# Patient Record
Sex: Male | Born: 2000 | Race: White | Hispanic: No | Marital: Single | Smoking: Never smoker
Health system: Southern US, Community
[De-identification: ages and names within clinical notes are randomized; demographics above are authoritative.]

## PROBLEM LIST (undated history)

## (undated) HISTORY — PX: NO PAST SURGERIES: SHX2092

---
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
## 2020-01-08 ENCOUNTER — Encounter: Payer: Self-pay | Admitting: *Deleted

## 2020-01-08 ENCOUNTER — Other Ambulatory Visit: Payer: Self-pay

## 2020-01-08 DIAGNOSIS — L299 Pruritus, unspecified: Secondary | ICD-10-CM | POA: Insufficient documentation

## 2020-01-08 DIAGNOSIS — L550 Sunburn of first degree: Secondary | ICD-10-CM | POA: Insufficient documentation

## 2020-01-08 DIAGNOSIS — Z5321 Procedure and treatment not carried out due to patient leaving prior to being seen by health care provider: Secondary | ICD-10-CM | POA: Insufficient documentation

## 2020-01-08 NOTE — ED Triage Notes (Signed)
Pt reports he went to the beach over the weekend, has sunburn on his chest and abdomen, has started to itch. He took benadryl about an hour ago and ibuprofen for the pain.

## 2020-01-09 ENCOUNTER — Ambulatory Visit (INDEPENDENT_AMBULATORY_CARE_PROVIDER_SITE_OTHER)
Admission: RE | Admit: 2020-01-09 | Discharge: 2020-01-09 | Disposition: A | Discharge status: Home / Self Care | Payer: 59 | Source: Ambulatory Visit

## 2020-01-09 ENCOUNTER — Emergency Department
Admission: EM | Admit: 2020-01-09 | Discharge: 2020-01-09 | Disposition: A | Discharge status: Home / Self Care | Payer: 59 | Attending: Emergency Medicine | Admitting: Emergency Medicine

## 2020-01-09 VITALS — BP 123/68 | HR 75 | Temp 98.0°F | Resp 18 | Ht 73.0 in | Wt 210.1 lb

## 2020-01-09 DIAGNOSIS — L550 Sunburn of first degree: Secondary | ICD-10-CM | POA: Diagnosis not present

## 2020-01-09 MED ORDER — SILVER SULFADIAZINE 1 % EX CREA: 1.0000 "application " | TOPICAL_CREAM | Freq: Two times a day (BID) | CUTANEOUS | 1 refills | Status: DC

## 2020-01-09 NOTE — ED Notes (Signed)
Attempted to call pt to exam room. Unable to locate pt.

## 2020-01-09 NOTE — ED Provider Notes (Signed)
[facility]    CSN: [id number] Arrival date & time: 01/09/20  1543      History   Chief Complaint Chief Complaint  Patient presents with  . Sunburn    HPI Allen PIZZI is a 19 y.o. male.   Patient is 19 year old male who presents with chief complaint of sunburn to his chest that he got on Saturday.  Patient reports intermittent flares of itchiness that started yesterday.  Patient is taken Benadryl and used cortisone cream and aloe with minimal relief of itching.  He states the Benadryl helped a little bit but did not last long.  Patient states he went to the ER to be seen last night but left after being told it was a 5-hour wait.  Patient was sunburn to the chest but not to the back no complaint of sunburn to the arms or legs.     History reviewed. No pertinent past medical history.  There are no problems to display for this patient.   Past Surgical History:  Procedure Laterality Date  . NO PAST SURGERIES         Home Medications    Prior to Admission medications   Medication Sig Start Date End Date Taking? Authorizing Provider  silver sulfADIAZINE (SILVADENE) 1 % cream Apply 1 application topically 2 (two) times daily. 01/09/20   Candis Schatz, PA-C    Family History Family History  Problem Relation Age of Onset  . Healthy Mother   . Healthy Father     Social History Social History   Tobacco Use  . Smoking status: Passive Smoke Exposure - Never Smoker  . Smokeless tobacco: Never Used  Vaping Use  . Vaping Use: Every day  Substance Use Topics  . Alcohol use: No    Alcohol/week: 0.0 standard drinks  . Drug use: No     Allergies   Patient has no known allergies.   Review of Systems Review of Systems as noted above in HPI.  Other systems reviewed and found to be negative   Physical Exam Triage Vital Signs ED Triage Vitals  Enc Vitals Group     BP 01/09/20 1612 123/68     Pulse Rate 01/09/20 1612 75     Resp 01/09/20 1612  18     Temp 01/09/20 1612 98 F (36.7 C)     Temp Source 01/09/20 1612 Oral     SpO2 01/09/20 1612 100 %     Weight 01/09/20 1610 210 lb 1.6 oz (95.3 kg)     Height 01/09/20 1610 6\' 1"  (1.854 m)     Head Circumference --      Peak Flow --      Pain Score 01/09/20 1609 5     Pain Loc --      Pain Edu? --      Excl. in GC? --    No data found.  Updated Vital Signs BP 123/68 (BP Location: Left Arm)   Pulse 75   Temp 98 F (36.7 C) (Oral)   Resp 18   Ht 6\' 1"  (1.854 m)   Wt 210 lb 1.6 oz (95.3 kg)   SpO2 100%   BMI 27.72 kg/m    Physical Exam Constitutional:      Appearance: Normal appearance.  Cardiovascular:     Rate and Rhythm: Normal rate.  Pulmonary:     Effort: No respiratory distress.  Skin:    Capillary Refill: Capillary refill takes less than 2 seconds.  Findings: Burn (sunburn) present.     Comments: Sunburn to the chest and abdomen but no burn noted to the back.  Minimal redness to the upper arms.  Neurological:     General: No focal deficit present.     Mental Status: He is alert and oriented to person, place, and time.      UC Treatments / Results  Labs (all labs ordered are listed, but only abnormal results are displayed) Labs Reviewed - No data to display  EKG   Radiology No results found.  Procedures Procedures (including critical care time)  Medications Ordered in UC Medications - No data to display  Initial Impression / Assessment and Plan / UC Course  I have reviewed the triage vital signs and the nursing notes.  Pertinent labs & imaging results that were available during my care of the patient were reviewed by me and considered in my medical decision making (see chart for details).    Patient with sunburn from this past Saturday, today is Tuesday.  Patient with intermittent flares of itching and some pain.  No improvement with topical cortisone or other creams.  We will give him prescription for Silvadene cream advised cool  compress can also be used for itching as needed.  Ibuprofen Tylenol as needed for pain.-Follow up should his symptoms worsen or not improve.  Final Clinical Impressions(s) / UC Diagnoses   Final diagnoses:  1st degree sunburn     Discharge Instructions     -Silvadene cream: Apply twice daily until symptoms are improved -Cold compress can also help with itching as needed -Ibuprofen or Tylenol as needed for pain max-follow-up with primary care provider this clinic    ED Prescriptions    Medication Sig Dispense Auth. Provider   silver sulfADIAZINE (SILVADENE) 1 % cream Apply 1 application topically 2 (two) times daily. 50 g Candis Schatz, PA-C     PDMP not reviewed this encounter.   Candis Schatz, PA-C 01/09/20 1736

## 2020-01-09 NOTE — ED Notes (Signed)
Called pt to exam room. Unable to locate pt.

## 2020-01-09 NOTE — ED Notes (Signed)
Called pt to exam room. Unable to locate pt.  °

## 2020-01-09 NOTE — Discharge Instructions (Signed)
-  Silvadene cream: Apply twice daily until symptoms are improved -Cold compress can also help with itching as needed -Ibuprofen or Tylenol as needed for pain max-follow-up with primary care provider this clinic should symptoms worsen or not improve.

## 2020-01-09 NOTE — ED Triage Notes (Signed)
Pt reports he went to the beach over the weekend, has sunburn on his chest and abdomen. He states he is itching to the point he can not stand it. He states he took benadryl and did not help.

## 2020-05-22 ENCOUNTER — Ambulatory Visit
Admission: EM | Admit: 2020-05-22 | Discharge: 2020-05-22 | Disposition: A | Discharge status: Home / Self Care | Payer: 59 | Attending: Emergency Medicine | Admitting: Emergency Medicine

## 2020-05-22 ENCOUNTER — Other Ambulatory Visit: Payer: Self-pay

## 2020-05-22 ENCOUNTER — Ambulatory Visit: Payer: Self-pay

## 2020-05-22 DIAGNOSIS — J069 Acute upper respiratory infection, unspecified: Secondary | ICD-10-CM | POA: Diagnosis not present

## 2020-05-22 NOTE — Discharge Instructions (Addendum)
Your COVID test is pending.  You should self quarantine until the test result is back.    Take Tylenol as needed for fever or discomfort.  Rest and keep yourself hydrated.    Go to the emergency department if you develop acute worsening symptoms.     

## 2020-05-22 NOTE — ED Triage Notes (Signed)
Pt presents with complaints of cough and headache x 3 days. Denies any other symptoms. Concerned for COVID.

## 2020-05-22 NOTE — ED Provider Notes (Signed)
[facility]    CSN: [id number] Arrival date & time: 05/22/20  1147      History   Chief Complaint Chief Complaint  Patient presents with  . Cough    HPI Allen Pearson is a 19 y.o. male.   Patient presents with body aches, headache, nonproductive cough x3 days.  He denies fever, shortness of breath, diarrhea, or other symptoms.  No treatments attempted at home.  Patient requests COVID test.  The history is provided by the patient.    History reviewed. No pertinent past medical history.  There are no problems to display for this patient.   Past Surgical History:  Procedure Laterality Date  . NO PAST SURGERIES         Home Medications    Prior to Admission medications   Medication Sig Start Date End Date Taking? Authorizing Provider  silver sulfADIAZINE (SILVADENE) 1 % cream Apply 1 application topically 2 (two) times daily. 01/09/20   Candis Schatz, PA-C    Family History Family History  Problem Relation Age of Onset  . Healthy Mother   . Healthy Father     Social History Social History   Tobacco Use  . Smoking status: Passive Smoke Exposure - Never Smoker  . Smokeless tobacco: Never Used  Vaping Use  . Vaping Use: Every day  Substance Use Topics  . Alcohol use: No    Alcohol/week: 0.0 standard drinks  . Drug use: No     Allergies   Patient has no known allergies.   Review of Systems Review of Systems  Constitutional: Negative for chills and fever.  HENT: Negative for ear pain and sore throat.   Eyes: Negative for pain and visual disturbance.  Respiratory: Positive for cough. Negative for shortness of breath.   Cardiovascular: Negative for chest pain and palpitations.  Gastrointestinal: Negative for abdominal pain, diarrhea and vomiting.  Genitourinary: Negative for dysuria and hematuria.  Musculoskeletal: Negative for arthralgias and back pain.  Skin: Negative for color change and rash.  Neurological: Positive for  headaches. Negative for seizures and syncope.  All other systems reviewed and are negative.    Physical Exam Triage Vital Signs ED Triage Vitals  Enc Vitals Group     BP 05/22/20 1159 124/78     Pulse Rate 05/22/20 1159 78     Resp 05/22/20 1159 19     Temp 05/22/20 1159 99 F (37.2 C)     Temp src --      SpO2 05/22/20 1159 98 %     Weight --      Height --      Head Circumference --      Peak Flow --      Pain Score 05/22/20 1158 0     Pain Loc --      Pain Edu? --      Excl. in GC? --    No data found.  Updated Vital Signs BP 124/78   Pulse 78   Temp 99 F (37.2 C)   Resp 19   SpO2 98%   Visual Acuity Right Eye Distance:   Left Eye Distance:   Bilateral Distance:    Right Eye Near:   Left Eye Near:    Bilateral Near:     Physical Exam Vitals and nursing note reviewed.  Constitutional:      General: He is not in acute distress.    Appearance: He is well-developed. He is not ill-appearing.  HENT:  Head: Normocephalic and atraumatic.     Right Ear: Tympanic membrane normal.     Left Ear: Tympanic membrane normal.     Nose: Nose normal.     Mouth/Throat:     Mouth: Mucous membranes are moist.     Pharynx: Oropharynx is clear.  Eyes:     Conjunctiva/sclera: Conjunctivae normal.  Cardiovascular:     Rate and Rhythm: Normal rate and regular rhythm.     Heart sounds: Normal heart sounds.  Pulmonary:     Effort: Pulmonary effort is normal. No respiratory distress.     Breath sounds: Normal breath sounds. No wheezing or rhonchi.  Abdominal:     Palpations: Abdomen is soft.     Tenderness: There is no abdominal tenderness.  Musculoskeletal:     Cervical back: Neck supple.  Skin:    General: Skin is warm and dry.     Findings: No rash.  Neurological:     General: No focal deficit present.     Mental Status: He is alert and oriented to person, place, and time.     Gait: Gait normal.  Psychiatric:        Mood and Affect: Mood normal.         Behavior: Behavior normal.      UC Treatments / Results  Labs (all labs ordered are listed, but only abnormal results are displayed) Labs Reviewed  NOVEL CORONAVIRUS, NAA    EKG   Radiology No results found.  Procedures Procedures (including critical care time)  Medications Ordered in UC Medications - No data to display  Initial Impression / Assessment and Plan / UC Course  I have reviewed the triage vital signs and the nursing notes.  Pertinent labs & imaging results that were available during my care of the patient were reviewed by me and considered in my medical decision making (see chart for details).   Viral URI with cough.  PCR COVID pending.  Instructed patient to self quarantine until the test result is back.  Discussed symptomatic treatment including Tylenol, rest, hydration.  Instructed patient to follow up with PCP if his symptoms are not improving  Patient agrees to plan of care.    Final Clinical Impressions(s) / UC Diagnoses   Final diagnoses:  Viral URI with cough     Discharge Instructions     Your COVID test is pending.  You should self quarantine until the test result is back.    Take Tylenol as needed for fever or discomfort.  Rest and keep yourself hydrated.    Go to the emergency department if you develop acute worsening symptoms.        ED Prescriptions    None     PDMP not reviewed this encounter.   Mickie Bail, NP 05/22/20 1215

## 2020-05-23 LAB — NOVEL CORONAVIRUS, NAA: SARS-CoV-2, NAA: DETECTED — AB

## 2020-05-23 LAB — SARS-COV-2, NAA 2 DAY TAT

## 2020-05-24 ENCOUNTER — Telehealth: Payer: Self-pay | Admitting: Nurse Practitioner

## 2020-05-24 NOTE — Telephone Encounter (Signed)
Called to Discuss with patient about Covid symptoms and the use of the monoclonal antibody infusion for those with mild to moderate Covid symptoms and at a high risk of hospitalization.     Pt appears to qualify for this infusion due to co-morbid conditions and/or a member of an at-risk group in accordance with the FDA Emergency Use Authorization. Patient reports symptoms are improving and declines infusion at this time. Aware to contact if symptoms worsens or changes his mind. Last eligible date 05/29/20.    Willette Alma, NP [facility]  [phone]

## 2020-08-07 ENCOUNTER — Ambulatory Visit (INDEPENDENT_AMBULATORY_CARE_PROVIDER_SITE_OTHER): Payer: Self-pay

## 2020-08-07 ENCOUNTER — Other Ambulatory Visit: Payer: Self-pay

## 2020-08-07 DIAGNOSIS — Z20822 Contact with and (suspected) exposure to covid-19: Secondary | ICD-10-CM

## 2020-08-07 LAB — POC SOFIA 2 FLU + SARS ANTIGEN FIA
Influenza A, POC: NEGATIVE
Influenza B, POC: NEGATIVE
SARS Coronavirus 2 Ag: NEGATIVE

## 2020-08-07 NOTE — Progress Notes (Signed)
Pt was self pay

## 2020-10-31 ENCOUNTER — Ambulatory Visit: Payer: Self-pay | Admitting: Family Medicine

## 2020-11-04 ENCOUNTER — Encounter: Payer: Self-pay | Admitting: Family Medicine

## 2020-11-04 ENCOUNTER — Ambulatory Visit (INDEPENDENT_AMBULATORY_CARE_PROVIDER_SITE_OTHER): Payer: Self-pay | Admitting: Family Medicine

## 2020-11-04 ENCOUNTER — Other Ambulatory Visit: Payer: Self-pay

## 2020-11-04 VITALS — BP 110/80 | HR 72 | Ht 73.0 in | Wt 206.0 lb

## 2020-11-04 DIAGNOSIS — M545 Low back pain, unspecified: Secondary | ICD-10-CM

## 2020-11-04 LAB — POCT URINALYSIS DIPSTICK
Bilirubin, UA: NEGATIVE
Blood, UA: NEGATIVE
Glucose, UA: NEGATIVE
Ketones, UA: NEGATIVE
Leukocytes, UA: NEGATIVE
Nitrite, UA: NEGATIVE
Protein, UA: POSITIVE — AB
Spec Grav, UA: 1.015 (ref 1.010–1.025)
Urobilinogen, UA: >=8 E.U./dL — AB
pH, UA: 7 (ref 5.0–8.0)

## 2020-11-04 MED ORDER — PREDNISONE 10 MG PO TABS: 10.0000 mg | ORAL_TABLET | Freq: Every day | ORAL | 0 refills | Status: DC

## 2020-11-04 MED ORDER — MELOXICAM 15 MG PO TABS: 15.0000 mg | ORAL_TABLET | Freq: Every day | ORAL | 0 refills | Status: DC

## 2020-11-04 MED ORDER — CYCLOBENZAPRINE HCL 10 MG PO TABS: 10.0000 mg | ORAL_TABLET | Freq: Every day | ORAL | 0 refills | Status: DC

## 2020-11-04 NOTE — Progress Notes (Signed)
Date:  11/04/2020   Name:  Allen Pearson   DOB:  2001-04-09   MRN:  [id number]   Chief Complaint: Back Pain (Squatted to pick something up and "pain shot up the back"- comes and goes now- bending down to lift something makes it hurt-no otc helps)  Back Pain This is a new problem. The current episode started in the past 7 days (Thursday). The problem occurs daily. The problem has been gradually improving since onset. The pain is present in the lumbar spine. The quality of the pain is described as aching and shooting. The pain does not radiate. The pain is at a severity of 7/10. The pain is moderate. The symptoms are aggravated by bending, twisting and sitting. Pertinent negatives include no abdominal pain, bladder incontinence, bowel incontinence, chest pain, dysuria, fever, headaches, leg pain, numbness, paresis, paresthesias, pelvic pain, perianal numbness, tingling, weakness or weight loss. He has tried NSAIDs (tylenol) for the symptoms. The treatment provided mild relief.    No results found for: CREATININE, BUN, NA, K, CL, CO2 No results found for: CHOL, HDL, LDLCALC, LDLDIRECT, TRIG, CHOLHDL No results found for: TSH No results found for: HGBA1C No results found for: WBC, HGB, HCT, MCV, PLT No results found for: ALT, AST, GGT, ALKPHOS, BILITOT   Review of Systems  Constitutional: Negative for chills, fever and weight loss.  HENT: Negative for drooling, ear discharge, ear pain and sore throat.   Respiratory: Negative for cough, shortness of breath and wheezing.   Cardiovascular: Negative for chest pain, palpitations and leg swelling.  Gastrointestinal: Negative for abdominal pain, blood in stool, bowel incontinence, constipation, diarrhea and nausea.  Endocrine: Negative for polydipsia.  Genitourinary: Negative for bladder incontinence, dysuria, frequency, hematuria, pelvic pain and urgency.  Musculoskeletal: Positive for back pain. Negative for myalgias and neck pain.  Skin:  Negative for rash.  Allergic/Immunologic: Negative for environmental allergies.  Neurological: Negative for dizziness, tingling, weakness, numbness, headaches and paresthesias.  Hematological: Does not bruise/bleed easily.  Psychiatric/Behavioral: Negative for suicidal ideas. The patient is not nervous/anxious.     There are no problems to display for this patient.   No Known Allergies  Past Surgical History:  Procedure Laterality Date  . NO PAST SURGERIES      Social History   Tobacco Use  . Smoking status: Passive Smoke Exposure - Never Smoker  . Smokeless tobacco: Never Used  Vaping Use  . Vaping Use: Every day  Substance Use Topics  . Alcohol use: Yes    Alcohol/week: 0.0 standard drinks    Comment: occasionally  . Drug use: No     Medication list has been reviewed and updated.  No outpatient medications have been marked as taking for the 11/04/20 encounter (Office Visit) with Duanne Limerick, MD.    [facility] 2/9 Scores 11/04/2020 09/04/2015  PHQ - 2 Score 0 0  PHQ- 9 Score 0 -    GAD 7 : Generalized Anxiety Score 11/04/2020  Nervous, Anxious, on Edge 0  Control/stop worrying 0  Worry too much - different things 0  Trouble relaxing 0  Restless 0  Easily annoyed or irritable 0  Afraid - awful might happen 0  Total GAD 7 Score 0    BP Readings from Last 3 Encounters:  11/04/20 110/80  05/22/20 124/78  01/09/20 123/68    Physical Exam Vitals and nursing note reviewed.  HENT:     Right Ear: Tympanic membrane, ear canal and external ear normal. There is no  impacted cerumen.     Left Ear: Tympanic membrane, ear canal and external ear normal. There is no impacted cerumen.     Nose: Nose normal. No congestion or rhinorrhea.     Mouth/Throat:     Mouth: Mucous membranes are moist.  Pulmonary:     Effort: No respiratory distress.     Breath sounds: No stridor. No wheezing, rhonchi or rales.  Chest:     Chest wall: No tenderness.  Abdominal:     General: Bowel  sounds are normal.     Tenderness: There is no abdominal tenderness.  Musculoskeletal:     Lumbar back: Spasms present. No swelling, edema, deformity or signs of trauma. Normal range of motion. Negative right straight leg raise test and negative left straight leg raise test. No scoliosis.     Wt Readings from Last 3 Encounters:  11/04/20 206 lb (93.4 kg) (94 %, Z= 1.54)*  01/09/20 210 lb 1.6 oz (95.3 kg) (96 %, Z= 1.70)*  01/08/20 210 lb (95.3 kg) (96 %, Z= 1.70)*   * Growth percentiles are based on CDC (Boys, 2-20 Years) data.    BP 110/80   Pulse 72   Ht 6\' 1"  (1.854 m)   Wt 206 lb (93.4 kg)   BMI 27.18 kg/m   Assessment and Plan: 1. Acute bilateral low back pain without sciatica New onset.  Persistent.  Stable.  Patient was bending over to pick up something and felt a sudden pain in his lumbar area that radiated up the back.  Exam is consistent with that lumbar strain with tenderness and spasm of paraspinal muscles bilateral.  We will initiate prednisone taper beginning at 40 mg 3 days 30 mg 3 days 20 mg 3 days and 10 mg 3 days.  Patient is also being given cyclobenzaprine 10 mg 1 every hour of sleep.  As well as meloxicam 15 mg once a day.  We will recheck patient in 7-10 days.  Has been instructed not to do any bending, twisting, or lifting greater than 10 pounds the remainder of this week. - POCT Urinalysis Dipstick - predniSONE (DELTASONE) 10 MG tablet; Take 1 tablet (10 mg total) by mouth daily with breakfast. Taper 4,4,4,3,3,3,2,2,2,1,1,1  Dispense: 30 tablet; Refill: 0 - cyclobenzaprine (FLEXERIL) 10 MG tablet; Take 1 tablet (10 mg total) by mouth at bedtime.  Dispense: 30 tablet; Refill: 0 - meloxicam (MOBIC) 15 MG tablet; Take 1 tablet (15 mg total) by mouth daily.  Dispense: 30 tablet; Refill: 0

## 2021-02-07 ENCOUNTER — Encounter: Payer: Self-pay | Admitting: Family Medicine

## 2021-02-07 ENCOUNTER — Ambulatory Visit (INDEPENDENT_AMBULATORY_CARE_PROVIDER_SITE_OTHER): Payer: Self-pay | Admitting: Family Medicine

## 2021-02-07 ENCOUNTER — Other Ambulatory Visit: Payer: Self-pay

## 2021-02-07 VITALS — BP 124/64 | HR 76 | Temp 98.4°F | Ht 74.0 in | Wt 219.0 lb

## 2021-02-07 DIAGNOSIS — L739 Follicular disorder, unspecified: Secondary | ICD-10-CM

## 2021-02-07 MED ORDER — DOXYCYCLINE HYCLATE 100 MG PO TABS: 100.0000 mg | ORAL_TABLET | Freq: Two times a day (BID) | ORAL | 0 refills | Status: DC

## 2021-02-07 NOTE — Progress Notes (Signed)
Date:  02/07/2021   Name:  Allen Pearson   DOB:  03/11/2001   MRN:  [id number]   Chief Complaint: No chief complaint on file.  Rash This is a new problem. The current episode started 1 to 4 weeks ago (10-14 days). The problem has been gradually worsening since onset. The affected locations include the chest, torso, left arm, right arm, right upper leg, right lower leg, left upper leg, left lower leg, abdomen, left buttock, right buttock, right hip and left hip (generalized sparing feet/hands/face). Rash characteristics: vesicuar/pustules/ small. Pertinent negatives include no congestion, cough, diarrhea, fatigue, fever, rhinorrhea, shortness of breath or sore throat. Past treatments include nothing.   No results found for: CREATININE, BUN, NA, K, CL, CO2 No results found for: CHOL, HDL, LDLCALC, LDLDIRECT, TRIG, CHOLHDL No results found for: TSH No results found for: HGBA1C No results found for: WBC, HGB, HCT, MCV, PLT No results found for: ALT, AST, GGT, ALKPHOS, BILITOT   Review of Systems  Constitutional:  Negative for chills, fatigue and fever.  HENT:  Negative for congestion, drooling, ear discharge, ear pain, mouth sores, rhinorrhea and sore throat.   Eyes:  Negative for redness.  Respiratory:  Negative for cough, shortness of breath and wheezing.   Cardiovascular:  Negative for chest pain, palpitations and leg swelling.  Gastrointestinal:  Negative for abdominal pain, blood in stool, constipation, diarrhea and nausea.  Endocrine: Negative for polydipsia.  Genitourinary:  Negative for dysuria, frequency, hematuria and urgency.  Musculoskeletal:  Negative for back pain, myalgias and neck pain.  Skin:  Positive for rash.  Allergic/Immunologic: Negative for environmental allergies.  Neurological:  Negative for dizziness and headaches.  Hematological:  Does not bruise/bleed easily.  Psychiatric/Behavioral:  Negative for suicidal ideas. The patient is not nervous/anxious.     There are no problems to display for this patient.   No Known Allergies  Past Surgical History:  Procedure Laterality Date   NO PAST SURGERIES      Social History   Tobacco Use   Smoking status: Passive Smoke Exposure - Never Smoker   Smokeless tobacco: Never  Vaping Use   Vaping Use: Every day  Substance Use Topics   Alcohol use: Yes    Alcohol/week: 0.0 standard drinks    Comment: occasionally   Drug use: No     Medication list has been reviewed and updated.  No outpatient medications have been marked as taking for the 02/07/21 encounter (Appointment) with Duanne Limerick, MD.    [facility] 2/9 Scores 11/04/2020 09/04/2015  PHQ - 2 Score 0 0  PHQ- 9 Score 0 -    GAD 7 : Generalized Anxiety Score 11/04/2020  Nervous, Anxious, on Edge 0  Control/stop worrying 0  Worry too much - different things 0  Trouble relaxing 0  Restless 0  Easily annoyed or irritable 0  Afraid - awful might happen 0  Total GAD 7 Score 0    BP Readings from Last 3 Encounters:  11/04/20 110/80  05/22/20 124/78  01/09/20 123/68    Physical Exam Vitals and nursing note reviewed.  Constitutional:      Appearance: He is not ill-appearing or toxic-appearing.  HENT:     Head: Normocephalic.     Right Ear: External ear normal.     Left Ear: External ear normal.     Nose: Nose normal. No congestion or rhinorrhea.     Mouth/Throat:     Pharynx: No posterior oropharyngeal erythema.  Eyes:  General: No scleral icterus.       Right eye: No discharge.        Left eye: No discharge.     Conjunctiva/sclera: Conjunctivae normal.     Pupils: Pupils are equal, round, and reactive to light.  Neck:     Thyroid: No thyromegaly.     Vascular: No JVD.     Trachea: No tracheal deviation.  Cardiovascular:     Rate and Rhythm: Normal rate and regular rhythm.  Pulmonary:     Breath sounds: Normal breath sounds. No rales.  Abdominal:     General: Bowel sounds are normal.     Palpations: Abdomen is  soft. There is no mass.     Tenderness: There is no abdominal tenderness. There is no guarding or rebound.  Musculoskeletal:        General: No tenderness. Normal range of motion.     Cervical back: Normal range of motion and neck supple.  Lymphadenopathy:     Cervical: No cervical adenopathy.  Skin:    General: Skin is warm.     Findings: No rash.  Neurological:     Mental Status: He is alert and oriented to person, place, and time.     Cranial Nerves: No cranial nerve deficit.     Deep Tendon Reflexes: Reflexes are normal and symmetric.    Wt Readings from Last 3 Encounters:  11/04/20 206 lb (93.4 kg) (94 %, Z= 1.54)*  01/09/20 210 lb 1.6 oz (95.3 kg) (96 %, Z= 1.70)*  01/08/20 210 lb (95.3 kg) (96 %, Z= 1.70)*   * Growth percentiles are based on CDC (Boys, 2-20 Years) data.    There were no vitals taken for this visit.  Assessment and Plan: 1. Folliculitis New onset.  Persistent for 2 weeks.  Patient has multiple small vesicular/pustular like areas on lower extremity arms and torso.  There is no petechial characteristic.  These are nonpruritic and do not have an erythematous base to suggest insect involvement.  I think this is a folliculitis and patient's been instructed to use an antibacterial soap and we will treat with doxycycline 100 mg twice a day for 10 days.  Monkeypox was entertained but patient is not in the cohort that is associated with this at this time and lesions seem to small to be associated as well at that patient does not have any other symptoms to suggest this as well

## 2023-01-11 ENCOUNTER — Encounter: Payer: Self-pay | Admitting: Family Medicine

## 2023-01-11 ENCOUNTER — Ambulatory Visit (INDEPENDENT_AMBULATORY_CARE_PROVIDER_SITE_OTHER): Payer: Self-pay | Admitting: Family Medicine

## 2023-01-11 VITALS — BP 102/70 | HR 72 | Ht 74.0 in | Wt 204.0 lb

## 2023-01-11 DIAGNOSIS — H60332 Swimmer's ear, left ear: Secondary | ICD-10-CM

## 2023-01-11 MED ORDER — AMOXICILLIN-POT CLAVULANATE 875-125 MG PO TABS: 1.0000 | ORAL_TABLET | Freq: Two times a day (BID) | ORAL | 0 refills | Status: AC

## 2023-01-11 MED ORDER — CIPRO HC 0.2-1 % OT SUSP: 3.0000 [drp] | Freq: Two times a day (BID) | OTIC | 0 refills | Status: DC

## 2023-01-11 NOTE — Progress Notes (Signed)
Date:  01/11/2023   Name:  Allen Pearson   DOB:  2000-12-27   MRN:  [id number]   Chief Complaint: Ear Drainage (L) ear drainage- used cipro drops x 2 days. No pain in ear now, but still draining yellow and can't hear out of that ear.)  Ear Drainage  There is pain in the left ear. This is a chronic problem. The current episode started in the past 7 days. The problem occurs every few hours. The problem has been gradually improving. There has been no fever. The pain is moderate. Associated symptoms include hearing loss. Pertinent negatives include no abdominal pain, coughing, diarrhea, ear discharge, neck pain, rhinorrhea, sore throat or vomiting. He has tried ear drops for the symptoms. The treatment provided moderate relief.    No results found for: "NA", "K", "CO2", "GLUCOSE", "BUN", "CREATININE", "CALCIUM", "EGFR", "GFRNONAA" No results found for: "CHOL", "HDL", "LDLCALC", "LDLDIRECT", "TRIG", "CHOLHDL" No results found for: "TSH" No results found for: "HGBA1C" No results found for: "WBC", "HGB", "HCT", "MCV", "PLT" No results found for: "ALT", "AST", "GGT", "ALKPHOS", "BILITOT" No results found for: "25OHVITD2", "25OHVITD3", "VD25OH"   Review of Systems  HENT:  Positive for hearing loss. Negative for ear discharge, postnasal drip, rhinorrhea, sinus pressure and sore throat.   Respiratory:  Negative for cough and shortness of breath.   Cardiovascular:  Negative for chest pain, palpitations and leg swelling.  Gastrointestinal:  Negative for abdominal pain, blood in stool, constipation, diarrhea and vomiting.  Musculoskeletal:  Negative for neck pain.  Neurological:  Negative for dizziness.  Hematological:  Negative for adenopathy. Does not bruise/bleed easily.    There are no problems to display for this patient.   No Known Allergies  Past Surgical History:  Procedure Laterality Date   NO PAST SURGERIES      Social History   Tobacco Use   Smoking status: Never     Passive exposure: Yes   Smokeless tobacco: Never  Vaping Use   Vaping Use: Every day  Substance Use Topics   Alcohol use: Yes    Alcohol/week: 0.0 standard drinks of alcohol    Comment: occasionally   Drug use: No     Medication list has been reviewed and updated.  No outpatient medications have been marked as taking for the 01/11/23 encounter (Office Visit) with Duanne Limerick, MD.       01/11/2023    4:33 PM 11/04/2020    3:43 PM  GAD 7 : Generalized Anxiety Score  Nervous, Anxious, on Edge 0 0  Control/stop worrying 0 0  Worry too much - different things 0 0  Trouble relaxing 0 0  Restless 0 0  Easily annoyed or irritable 0 0  Afraid - awful might happen 0 0  Total GAD 7 Score 0 0  Anxiety Difficulty Not difficult at all        01/11/2023    4:33 PM 11/04/2020    3:43 PM 09/04/2015    8:55 AM  Depression screen PHQ 2/9  Decreased Interest 0 0 0  Down, Depressed, Hopeless 0 0 0  PHQ - 2 Score 0 0 0  Altered sleeping 0 0   Tired, decreased energy 0 0   Change in appetite 0 0   Feeling bad or failure about yourself  0 0   Trouble concentrating 0 0   Moving slowly or fidgety/restless 0 0   Suicidal thoughts 0 0   PHQ-9 Score 0 0   Difficult doing  work/chores Not difficult at all      BP Readings from Last 3 Encounters:  01/11/23 102/70  02/07/21 124/64  11/04/20 110/80    Physical Exam Vitals and nursing note reviewed.  HENT:     Head: Normocephalic.     Right Ear: Tympanic membrane and external ear normal.     Left Ear: Tympanic membrane and external ear normal.     Nose: Nose normal.  Eyes:     General: No scleral icterus.       Right eye: No discharge.        Left eye: No discharge.     Conjunctiva/sclera: Conjunctivae normal.     Pupils: Pupils are equal, round, and reactive to light.  Neck:     Thyroid: No thyromegaly.     Vascular: No JVD.     Trachea: No tracheal deviation.  Cardiovascular:     Rate and Rhythm: Normal rate and regular rhythm.      Heart sounds: Normal heart sounds. No murmur heard.    No friction rub. No gallop.  Pulmonary:     Effort: No respiratory distress.     Breath sounds: Normal breath sounds. No wheezing, rhonchi or rales.  Abdominal:     General: Bowel sounds are normal.     Palpations: Abdomen is soft. There is no mass.     Tenderness: There is no abdominal tenderness. There is no guarding or rebound.  Musculoskeletal:        General: No tenderness. Normal range of motion.     Cervical back: Normal range of motion and neck supple.  Lymphadenopathy:     Cervical: No cervical adenopathy.  Skin:    General: Skin is warm.     Findings: No rash.  Neurological:     Mental Status: He is alert and oriented to person, place, and time.     Cranial Nerves: No cranial nerve deficit.     Deep Tendon Reflexes: Reflexes are normal and symmetric.     Wt Readings from Last 3 Encounters:  01/11/23 204 lb (92.5 kg)  02/07/21 219 lb (99.3 kg)  11/04/20 206 lb (93.4 kg) (94 %, Z= 1.54)*   * Growth percentiles are based on CDC (Boys, 2-20 Years) data.    BP 102/70   Pulse 72   Ht 6\' 2"  (1.88 m)   Wt 204 lb (92.5 kg)   SpO2 98%   BMI 26.19 kg/m   Assessment and Plan:  1. Acute swimmer's ear of left side New onset.  Persistent.  Improved on Ciprodex.  However has decreased hearing now.  There is some clears serous fluid in the auditory canal consistent with probable perforation.  Will treat for otitis media with Augmentin 875 mg twice a day and we will use some ciprofloxacin hydrocortisone suspension 3 drops in left ear. - ciprofloxacin-hydrocortisone (CIPRO HC) OTIC suspension; Place 3 drops into the left ear 2 (two) times daily.  Dispense: 10 mL; Refill: 0 - amoxicillin-clavulanate (AUGMENTIN) 875-125 MG tablet; Take 1 tablet by mouth 2 (two) times daily.  Dispense: 20 tablet; Refill: 0    Elizabeth Sauer, MD
